# Patient Record
Sex: Male | Born: 1990 | Race: White | Hispanic: No | Marital: Single | Smoking: Current some day smoker
Health system: Southern US, Community
[De-identification: ages and names within clinical notes are randomized; demographics above are authoritative.]

## PROBLEM LIST (undated history)

## (undated) DIAGNOSIS — F32A Depression, unspecified: Secondary | ICD-10-CM

## (undated) DIAGNOSIS — F419 Anxiety disorder, unspecified: Secondary | ICD-10-CM

## (undated) DIAGNOSIS — F329 Major depressive disorder, single episode, unspecified: Secondary | ICD-10-CM

## (undated) HISTORY — PX: APPENDECTOMY: SHX54

---
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
## 2012-03-07 ENCOUNTER — Emergency Department (HOSPITAL_COMMUNITY)
Admission: EM | Admit: 2012-03-07 | Discharge: 2012-03-07 | Disposition: A | Discharge status: Home / Self Care | Payer: Self-pay | Attending: Emergency Medicine | Admitting: Emergency Medicine

## 2012-03-07 DIAGNOSIS — W260XXA Contact with knife, initial encounter: Secondary | ICD-10-CM | POA: Insufficient documentation

## 2012-03-07 DIAGNOSIS — S61209A Unspecified open wound of unspecified finger without damage to nail, initial encounter: Secondary | ICD-10-CM | POA: Insufficient documentation

## 2012-03-07 DIAGNOSIS — Y929 Unspecified place or not applicable: Secondary | ICD-10-CM | POA: Insufficient documentation

## 2012-03-07 DIAGNOSIS — Y9389 Activity, other specified: Secondary | ICD-10-CM | POA: Insufficient documentation

## 2012-03-07 DIAGNOSIS — S61219A Laceration without foreign body of unspecified finger without damage to nail, initial encounter: Secondary | ICD-10-CM

## 2012-03-07 DIAGNOSIS — W261XXA Contact with sword or dagger, initial encounter: Secondary | ICD-10-CM | POA: Insufficient documentation

## 2012-03-07 NOTE — ED Notes (Signed)
Pt was cutting an apple and  Slipped and cut his left pinky finger tip with a knife

## 2012-03-07 NOTE — ED Provider Notes (Signed)
History     CSN: [id number]  Arrival date & time 03/07/12  1723   First MD Initiated Contact with Patient 03/07/12 1743      No chief complaint on file.   (Consider location/radiation/quality/duration/timing/severity/associated sxs/prior treatment) HPI Comments: Patient presents with a laceration of the distal palmar surface of his right index finger.  Laceration approximately 1 cm in length.  He reports that approximately 3 hours prior to arrival he cut his finger with a clean knife while cutting an apple.  Bleeding controlled at this time.  He has been applying pressure to the area with a guaze.  He is not diabetic.  Not immunocompromised.  Last Tetanus was three years ago.  The history is provided by the patient.    No past medical history on file.  No past surgical history on file.  No family history on file.  History  Substance Use Topics  . Smoking status: Not on file  . Smokeless tobacco: Not on file  . Alcohol Use: Not on file      Review of Systems  Skin:       Finger laceration  Neurological: Negative for numbness.  All other systems reviewed and are negative.    Allergies  Review of patient's allergies indicates no known allergies.  Home Medications   Current Outpatient Rx  Name  Route  Sig  Dispense  Refill  . B COMPLEX VITAMINS PO CAPS   Oral   Take 1 capsule by mouth daily.         Marland Kitchen DIPHENHYDRAMINE HCL 25 MG PO TABS   Oral   Take 25 mg by mouth every 6 (six) hours as needed. allergies         . IBUPROFEN 200 MG PO TABS   Oral   Take 200 mg by mouth every 6 (six) hours as needed. pain         . ST JOHNS WORT EXTRACT PO   Oral   Take 1 capsule by mouth daily.           There were no vitals taken for this visit.  Physical Exam  Nursing note and vitals reviewed. Constitutional: He appears well-developed and well-nourished. No distress.  HENT:  Head: Normocephalic and atraumatic.  Cardiovascular: Normal rate, regular rhythm  and normal heart sounds.   Pulses:      Radial pulses are 2+ on the right side, and 2+ on the left side.  Pulmonary/Chest: Effort normal and breath sounds normal.  Musculoskeletal:       Patient with full flexion of the right index DIP, PIP, and MCP  Neurological: He is alert. No sensory deficit.       Distal sensation of the right index finger intact  Skin: Skin is warm. He is not diaphoretic.       Approximately 1 cm linear superficial laceration of the distal right index finger.  No involvement of the nail.  Psychiatric: He has a normal mood and affect.    ED Course  Procedures (including critical care time)  Labs Reviewed - No data to display No results found.   No diagnosis found.  LACERATION REPAIR Performed by: Anne Shutter, Krystale Rinkenberger Authorized by: Anne Shutter, Herbert Seta Consent: Verbal consent obtained. Risks and benefits: risks, benefits and alternatives were discussed Consent given by: patient Patient identity confirmed: provided demographic data Prepped and Draped in normal sterile fashion Wound explored  Laceration Location: right index finger  Laceration Length: 1 cm  No Foreign Bodies seen  or palpated  Irrigation method: syringe Amount of cleaning: standard  Skin closure: Dermabond  Patient tolerance: Patient tolerated the procedure well with no immediate complications.   MDM  Patient presenting with a superficial laceration of the right index finger.  Laceration occurred while slicing an apple.  Laceration repaired with Dermabond without complications.  Patient neurovascularly intact.  Tetanus UTD.  Return precautions discussed.        Pascal Lux [place], PA-C 03/07/12 2117

## 2012-03-07 NOTE — ED Provider Notes (Signed)
Medical screening examination/treatment/procedure(s) were performed by non-physician practitioner and as supervising physician I was immediately available for consultation/collaboration.  Geoffery Lyons, MD 03/07/12 [phone]

## 2015-04-25 ENCOUNTER — Emergency Department (HOSPITAL_COMMUNITY): Payer: Self-pay

## 2015-04-25 ENCOUNTER — Encounter (HOSPITAL_COMMUNITY): Payer: Self-pay

## 2015-04-25 ENCOUNTER — Emergency Department (HOSPITAL_COMMUNITY)
Admission: EM | Admit: 2015-04-25 | Discharge: 2015-04-25 | Disposition: A | Discharge status: Home / Self Care | Payer: Self-pay | Attending: Emergency Medicine | Admitting: Emergency Medicine

## 2015-04-25 DIAGNOSIS — J159 Unspecified bacterial pneumonia: Secondary | ICD-10-CM | POA: Insufficient documentation

## 2015-04-25 DIAGNOSIS — J189 Pneumonia, unspecified organism: Secondary | ICD-10-CM

## 2015-04-25 DIAGNOSIS — F419 Anxiety disorder, unspecified: Secondary | ICD-10-CM | POA: Insufficient documentation

## 2015-04-25 DIAGNOSIS — F329 Major depressive disorder, single episode, unspecified: Secondary | ICD-10-CM | POA: Insufficient documentation

## 2015-04-25 DIAGNOSIS — F17200 Nicotine dependence, unspecified, uncomplicated: Secondary | ICD-10-CM | POA: Insufficient documentation

## 2015-04-25 DIAGNOSIS — Z79899 Other long term (current) drug therapy: Secondary | ICD-10-CM | POA: Insufficient documentation

## 2015-04-25 HISTORY — DX: Anxiety disorder, unspecified: F41.9

## 2015-04-25 HISTORY — DX: Major depressive disorder, single episode, unspecified: F32.9

## 2015-04-25 HISTORY — DX: Depression, unspecified: F32.A

## 2015-04-25 LAB — CBC WITH DIFFERENTIAL/PLATELET
BASOS ABS: 0 10*3/uL (ref 0.0–0.1)
Basophils Relative: 0 %
Eosinophils Absolute: 0.1 10*3/uL (ref 0.0–0.7)
Eosinophils Relative: 2 %
HEMATOCRIT: 44 % (ref 39.0–52.0)
HEMOGLOBIN: 15.1 g/dL (ref 13.0–17.0)
LYMPHS PCT: 18 %
Lymphs Abs: 1.1 10*3/uL (ref 0.7–4.0)
MCH: 31 pg (ref 26.0–34.0)
MCHC: 34.3 g/dL (ref 30.0–36.0)
MCV: 90.3 fL (ref 78.0–100.0)
Monocytes Absolute: 0.5 10*3/uL (ref 0.1–1.0)
Monocytes Relative: 7 %
NEUTROS ABS: 4.5 10*3/uL (ref 1.7–7.7)
NEUTROS PCT: 73 %
Platelets: 212 10*3/uL (ref 150–400)
RBC: 4.87 MIL/uL (ref 4.22–5.81)
RDW: 11.7 % (ref 11.5–15.5)
WBC: 6.2 10*3/uL (ref 4.0–10.5)

## 2015-04-25 LAB — COMPREHENSIVE METABOLIC PANEL
ALT: 20 U/L (ref 17–63)
AST: 22 U/L (ref 15–41)
Albumin: 4.8 g/dL (ref 3.5–5.0)
Alkaline Phosphatase: 80 U/L (ref 38–126)
Anion gap: 9 (ref 5–15)
BILIRUBIN TOTAL: 0.4 mg/dL (ref 0.3–1.2)
BUN: 10 mg/dL (ref 6–20)
CO2: 29 mmol/L (ref 22–32)
CREATININE: 0.93 mg/dL (ref 0.61–1.24)
Calcium: 9.6 mg/dL (ref 8.9–10.3)
Chloride: 104 mmol/L (ref 101–111)
GFR calc Af Amer: >60 mL/min (ref >60)
GLUCOSE: 102 mg/dL — AB (ref 65–99)
Potassium: 5 mmol/L (ref 3.5–5.1)
Sodium: 142 mmol/L (ref 135–145)
TOTAL PROTEIN: 8 g/dL (ref 6.5–8.1)

## 2015-04-25 LAB — URINALYSIS, ROUTINE W REFLEX MICROSCOPIC
Bilirubin Urine: NEGATIVE
GLUCOSE, UA: NEGATIVE mg/dL
Hgb urine dipstick: NEGATIVE
Ketones, ur: NEGATIVE mg/dL
LEUKOCYTES UA: NEGATIVE
Nitrite: NEGATIVE
PROTEIN: NEGATIVE mg/dL
Specific Gravity, Urine: 1.004 — ABNORMAL LOW (ref 1.005–1.030)
pH: 7.5 (ref 5.0–8.0)

## 2015-04-25 LAB — LIPASE, BLOOD: LIPASE: 31 U/L (ref 11–51)

## 2015-04-25 MED ORDER — CEPHALEXIN 500 MG PO CAPS: 500.0000 mg | ORAL_CAPSULE | Freq: Four times a day (QID) | ORAL | Status: AC

## 2015-04-25 MED ORDER — KETOROLAC TROMETHAMINE 60 MG/2ML IM SOLN
60.0000 mg | Freq: Once | INTRAMUSCULAR | Status: AC
  Administered 2015-04-25: 60 mg via INTRAMUSCULAR
  Filled 2015-04-25: qty 2

## 2015-04-25 NOTE — ED Notes (Signed)
Pt states rt flank pain since Wednesday Morning.  No change in urination or fever.  Pt also here for insomnia d/t medication withdrawal.   Using marijuana to help go to sleep

## 2015-04-25 NOTE — ED Provider Notes (Signed)
CSN: [id number]     Arrival date & time 04/25/15  0907 History   First MD Initiated Contact with Patient 04/25/15 [phone]     Chief Complaint  Patient presents with  . Flank Pain     (Consider location/radiation/quality/duration/timing/severity/associated sxs/prior Treatment) Patient is a 25 y.o. male presenting with flank pain.  Flank Pain This is a new problem. The current episode started more than 2 days ago. The problem occurs constantly. The problem has not changed since onset.Pertinent negatives include no chest pain, no abdominal pain, no headaches and no shortness of breath. Nothing aggravates the symptoms. Nothing relieves the symptoms. He has tried nothing for the symptoms. The treatment provided no relief.    25 yo M with a chief complaint right flank pain. This been going on for about 5 or 6 days. Patient is unsure what makes it worse. Pain is been slowly worsening over that time course. Describes it as sharp and stabbing comes in waves. Denies radiation. Denies fevers or chills nausea or vomiting. Denies urinary symptoms. Denies injuries. Denies abdominal pain. Denies cough congestion. The patient is also concerned about insomnia. Patient had stopped an antidepressant about a year ago and has had persistent difficulty staying asleep. Has tried Benadryl melatonin marijuana.  Past Medical History  Diagnosis Date  . Depression   . Anxiety    Past Surgical History  Procedure Laterality Date  . Appendectomy     History reviewed. No pertinent family history. Social History  Substance Use Topics  . Smoking status: Current Some Day Smoker  . Smokeless tobacco: None  . Alcohol Use: Yes     Comment: social    Review of Systems  Constitutional: Negative for fever and chills.  HENT: Negative for congestion and facial swelling.   Eyes: Negative for discharge and visual disturbance.  Respiratory: Negative for shortness of breath.   Cardiovascular: Negative for chest pain and  palpitations.  Gastrointestinal: Negative for vomiting, abdominal pain and diarrhea.  Genitourinary: Positive for flank pain. Negative for hematuria.  Musculoskeletal: Negative for myalgias and arthralgias.  Skin: Negative for color change and rash.  Neurological: Negative for tremors, syncope and headaches.  Psychiatric/Behavioral: Negative for confusion and dysphoric mood.      Allergies  Review of patient's allergies indicates no known allergies.  Home Medications   Prior to Admission medications   Medication Sig Start Date End Date Taking? Authorizing Provider  diphenhydrAMINE (BENADRYL) 25 MG tablet Take 25 mg by mouth every 6 (six) hours as needed. allergies   Yes Historical Provider, MD  ibuprofen (ADVIL,MOTRIN) 200 MG tablet Take 200 mg by mouth every 6 (six) hours as needed. pain   Yes Historical Provider, MD  magnesium oxide (MAG-OX) 400 MG tablet Take 400 mg by mouth 2 (two) times daily as needed (muscle spasms).   Yes Historical Provider, MD  Multiple Vitamins-Minerals (MULTIVITAMIN WITH MINERALS) tablet Take 1 tablet by mouth daily.   Yes Historical Provider, MD  traMADol (ULTRAM) 50 MG tablet Take 100 mg by mouth at bedtime as needed (sleep).   Yes Historical Provider, MD  cephALEXin (KEFLEX) 500 MG capsule Take 1 capsule (500 mg total) by mouth 4 (four) times daily. 04/25/15   Melene Plan, DO   BP 130/78 mmHg  Pulse 76  Temp(Src) 98.4 F (36.9 C) (Oral)  Resp 18  SpO2 100% Physical Exam  Constitutional: He is oriented to person, place, and time. He appears well-developed and well-nourished.  HENT:  Head: Normocephalic and atraumatic.  Eyes: EOM  are normal. Pupils are equal, round, and reactive to light.  Neck: Normal range of motion. Neck supple. No JVD present.  Cardiovascular: Normal rate and regular rhythm.  Exam reveals no gallop and no friction rub.   No murmur heard. Pulmonary/Chest: No respiratory distress. He has no wheezes.  Abdominal: He exhibits no  distension. There is no tenderness. There is no rebound and no guarding.  No cva ttp  Musculoskeletal: Normal range of motion.  Neurological: He is alert and oriented to person, place, and time.  Skin: No rash noted. No pallor.  Psychiatric: He has a normal mood and affect. His behavior is normal.  Nursing note and vitals reviewed.   ED Course  Procedures (including critical care time) Labs Review Labs Reviewed  URINALYSIS, ROUTINE W REFLEX MICROSCOPIC (NOT AT [facility]) - Abnormal; Notable for the following:    Specific Gravity, Urine 1.004 (*)    All other components within normal limits  COMPREHENSIVE METABOLIC PANEL - Abnormal; Notable for the following:    Glucose, Bld 102 (*)    All other components within normal limits  CBC WITH DIFFERENTIAL/PLATELET  LIPASE, BLOOD    Imaging Review Ct Renal Stone Study  04/25/2015  CLINICAL DATA:  25 year old with right flank pain for nearly a week. Pain radiating to the back. Initial encounter. EXAM: CT ABDOMEN AND PELVIS WITHOUT CONTRAST TECHNIQUE: Multidetector CT imaging of the abdomen and pelvis was performed following the standard protocol without IV contrast. COMPARISON:  Negative. FINDINGS: 3.5 cm diameter area of rounded confluent opacity in the right lateral costophrenic angle at the lateral basal segment of the lower lobe (series 4, image 13 and series 5, image 47. This has patchy and indistinct margins. There is no associated right pleural effusion. Elsewhere the lung bases are negative.  No pericardial effusion. No acute osseous abnormality identified. No pelvic free fluid. Retained stool in the distal colon with some redundancy, especially the sigmoid which is moderately redundant. Less retained stool in the left colon. Mildly redundant splenic flexure. Retained stool throughout the transverse and right colon. Sequelae of appendectomy with multiple surgical clips at the cecum. Negative terminal ileum. No dilated small bowel. Decompressed  stomach and duodenum. No abdominal free air or free fluid. Noncontrast liver, gallbladder, spleen, pancreas and adrenal glands are within normal limits. No nephrolithiasis or perinephric stranding. Proximal ureters appear within normal limits. No periureteral stranding. No calculus identified along the course of either ureter or in the bladder. Diminutive urinary bladder. The bladder is somewhat thick walled, but this might be artifact due to under distension. No lymphadenopathy identified. IMPRESSION: 1. Symptomatic abnormality favored to be round airspace disease in the lateral basal segment of the right lower lobe most compatible at this time with round pneumonia. No associated pleural effusion. Followup PA and lateral chest X-ray is recommended in 3-4 weeks following trial of antibiotic therapy to ensure resolution and exclude underlying malignancy. 2. Mildly thick-walled appearance of the urinary bladder, but may be artifact due to under distension. 3. Otherwise negative noncontrast abdomen with prior appendectomy. Electronically Signed   By: Odessa Fleming M.D.   On: 04/25/2015 10:33   I have personally reviewed and evaluated these images and lab results as part of my medical decision-making.   EKG Interpretation None      MDM   Final diagnoses:  CAP (community acquired pneumonia)    25 yo M with a chief complaints of right flank pain. Is been going on for about a week. Colicky in nature  has no history of kidney stones. Will obtain a CT stone study.  Found to have RLL pna.  On questioning patient has had cough and fever.  Will start on abx.  PCP follow up given.  3:50 PM:  I have discussed the diagnosis/risks/treatment options with the patient and believe the pt to be eligible for discharge home to follow-up with PCP. We also discussed returning to the ED immediately if new or worsening sx occur. We discussed the sx which are most concerning (e.g., sudden worsening pain, sob) that necessitate  immediate return. Medications administered to the patient during their visit and any new prescriptions provided to the patient are listed below.  Medications given during this visit Medications  ketorolac (TORADOL) injection 60 mg (60 mg Intramuscular Given 04/25/15 1034)    Discharge Medication List as of 04/25/2015 11:19 AM    START taking these medications   Details  cephALEXin (KEFLEX) 500 MG capsule Take 1 capsule (500 mg total) by mouth 4 (four) times daily., Starting 04/25/2015, Until Discontinued, Print        The patient appears reasonably screen and/or stabilized for discharge and I doubt any other medical condition or other [facility] requiring further screening, evaluation, or treatment in the ED at this time prior to discharge.    Melene Plan, DO 04/25/15 1550

## 2015-04-25 NOTE — Discharge Instructions (Signed)

## 2015-04-25 NOTE — Progress Notes (Signed)
Pt seen by [facility] staff, Kennyth Arnold

## 2015-05-08 ENCOUNTER — Encounter (HOSPITAL_COMMUNITY): Payer: Self-pay

## 2015-05-08 ENCOUNTER — Emergency Department (HOSPITAL_COMMUNITY)
Admission: EM | Admit: 2015-05-08 | Discharge: 2015-05-08 | Disposition: A | Discharge status: Home / Self Care | Payer: Self-pay | Attending: Emergency Medicine | Admitting: Emergency Medicine

## 2015-05-08 ENCOUNTER — Emergency Department (HOSPITAL_COMMUNITY): Payer: Self-pay

## 2015-05-08 DIAGNOSIS — F419 Anxiety disorder, unspecified: Secondary | ICD-10-CM | POA: Insufficient documentation

## 2015-05-08 DIAGNOSIS — Z79899 Other long term (current) drug therapy: Secondary | ICD-10-CM | POA: Insufficient documentation

## 2015-05-08 DIAGNOSIS — F329 Major depressive disorder, single episode, unspecified: Secondary | ICD-10-CM | POA: Insufficient documentation

## 2015-05-08 DIAGNOSIS — J069 Acute upper respiratory infection, unspecified: Secondary | ICD-10-CM | POA: Insufficient documentation

## 2015-05-08 DIAGNOSIS — F17200 Nicotine dependence, unspecified, uncomplicated: Secondary | ICD-10-CM | POA: Insufficient documentation

## 2015-05-08 MED ORDER — BENZONATATE 100 MG PO CAPS: 200.0000 mg | ORAL_CAPSULE | Freq: Two times a day (BID) | ORAL | Status: AC | PRN

## 2015-05-08 MED ORDER — OXYMETAZOLINE HCL 0.05 % NA SOLN: 1.0000 | Freq: Two times a day (BID) | NASAL | Status: AC

## 2015-05-08 NOTE — ED Provider Notes (Signed)
CSN: [id number]     Arrival date & time 05/08/15  4098 History   First MD Initiated Contact with Patient 05/08/15 1059     Chief Complaint  Patient presents with  . Chills  . Ribcage Pain   . Cough     (Consider location/radiation/quality/duration/timing/severity/associated sxs/prior Treatment) HPI   Patient is a 25 year old male with past medical history of depression and anxiety who presents the ED with multiple complaints, onset 3 days. Patient reports he was seen in the ED on 04/25/15, diagnosed with CAP and discharged home with Keflex. He notes he completed his antibiotic and states his symptoms resolved until 3 days ago when he began having symptoms again. He endorses having "right lung soreness", chills, low-grade fever (99.3), cough with intermittent clear sputum, nasal congestion, rhinorrhea, sore throat and shortness of breath. He states he has been taking ibuprofen and Benadryl at home with mild intermittent relief. Patient also reports that he smokes cannabis nightly in order to help him stay asleep. He notes he stopped taking his antidepressant a year ago but has not followed up with a PCP due to lack of insurance.   Past Medical History  Diagnosis Date  . Depression   . Anxiety    Past Surgical History  Procedure Laterality Date  . Appendectomy     History reviewed. No pertinent family history. Social History  Substance Use Topics  . Smoking status: Current Some Day Smoker  . Smokeless tobacco: None  . Alcohol Use: Yes     Comment: social    Review of Systems  Constitutional: Positive for fever.  HENT: Positive for congestion and sore throat.   Respiratory: Positive for cough, chest tightness and shortness of breath.   Cardiovascular: Positive for chest pain.  All other systems reviewed and are negative.     Allergies  Review of patient's allergies indicates no known allergies.  Home Medications   Prior to Admission medications   Medication Sig Start  Date End Date Taking? Authorizing Provider  benzonatate (TESSALON) 100 MG capsule Take 2 capsules (200 mg total) by mouth 2 (two) times daily as needed for cough. 05/08/15   Barrett Henle, PA-C  cephALEXin (KEFLEX) 500 MG capsule Take 1 capsule (500 mg total) by mouth 4 (four) times daily. 04/25/15   Melene Plan, DO  diphenhydrAMINE (BENADRYL) 25 MG tablet Take 25 mg by mouth every 6 (six) hours as needed. allergies    Historical Provider, MD  ibuprofen (ADVIL,MOTRIN) 200 MG tablet Take 200 mg by mouth every 6 (six) hours as needed. pain    Historical Provider, MD  magnesium oxide (MAG-OX) 400 MG tablet Take 400 mg by mouth 2 (two) times daily as needed (muscle spasms).    Historical Provider, MD  Multiple Vitamins-Minerals (MULTIVITAMIN WITH MINERALS) tablet Take 1 tablet by mouth daily.    Historical Provider, MD  oxymetazoline (AFRIN NASAL SPRAY) 0.05 % nasal spray Place 1 spray into both nostrils 2 (two) times daily. 05/08/15   Barrett Henle, PA-C  traMADol (ULTRAM) 50 MG tablet Take 100 mg by mouth at bedtime as needed (sleep).    Historical Provider, MD   BP 128/78 mmHg  Pulse 95  Temp(Src) 98.7 F (37.1 C) (Oral)  Resp 16  SpO2 100% Physical Exam  Constitutional: He is oriented to person, place, and time. He appears well-developed and well-nourished. No distress.  HENT:  Head: Normocephalic and atraumatic.  Right Ear: Tympanic membrane normal.  Left Ear: Tympanic membrane normal.  Nose: Rhinorrhea  present. Right sinus exhibits no maxillary sinus tenderness and no frontal sinus tenderness. Left sinus exhibits no maxillary sinus tenderness and no frontal sinus tenderness.  Mouth/Throat: Uvula is midline, oropharynx is clear and moist and mucous membranes are normal. No oropharyngeal exudate, posterior oropharyngeal edema, posterior oropharyngeal erythema or tonsillar abscesses.  Eyes: Conjunctivae and EOM are normal. Right eye exhibits no discharge. Left eye exhibits no  discharge. No scleral icterus.  Neck: Normal range of motion. Neck supple.  Cardiovascular: Normal rate, regular rhythm, normal heart sounds and intact distal pulses.   Pulmonary/Chest: Effort normal and breath sounds normal. No respiratory distress. He has no wheezes. He has no rales. He exhibits no tenderness.  Abdominal: Soft. Bowel sounds are normal. There is no tenderness.  Musculoskeletal: Normal range of motion. He exhibits no edema or tenderness.  Lymphadenopathy:    He has no cervical adenopathy.  Neurological: He is alert and oriented to person, place, and time.  Skin: Skin is warm and dry. He is not diaphoretic.  Nursing note and vitals reviewed.   ED Course  Procedures (including critical care time) Labs Review Labs Reviewed - No data to display  Imaging Review Dg Chest 2 View  05/08/2015  CLINICAL DATA:  25 year old male with cough and congestion. EXAM: CHEST  2 VIEW COMPARISON:  None. FINDINGS: The heart size and mediastinal contours are within normal limits. Both lungs are clear. The visualized skeletal structures are unremarkable. IMPRESSION: No active cardiopulmonary disease. Electronically Signed   By: Elgie Collard M.D.   On: 05/08/2015 10:14   I have personally reviewed and evaluated these images and lab results as part of my medical decision-making.    MDM   Final diagnoses:  URI (upper respiratory infection)   Pt CXR negative for acute infiltrate. Patients symptoms are consistent with URI, likely viral etiology. Discussed that antibiotics are not indicated for viral infections. Pt will be discharged with symptomatic treatment.  Patient was also given follow-up information for the health and wellness clinic and advised to schedule a follow-up appointment with them regarding his insomnia. Verbalizes understanding and is agreeable with plan. Pt is hemodynamically stable & in NAD prior to dc.     Satira Sark [place], [place] 05/08/15 1131  Pricilla Loveless,  MD 05/10/15 [phone]

## 2015-05-08 NOTE — ED Notes (Addendum)
Pt c/o chills, cough, sore throat, and R ribcage/mid back pain x 2 days.  Pain score 4/10.  Pt reports being diagnosed w/ PNA on 2/7 and completed all prescribed medications.  Pt easily speaking full sentences.  Pt has not followed up w/ PCP, due to not having insurance.

## 2015-05-08 NOTE — Discharge Instructions (Signed)
Take your medications as prescribed. He may also use a cool mist humidifier as needed for cough at home. Continue drinking fluids to remain hydrated. He may also drink warm water or tea with half a teaspoon of honey to help with her cough. He may take Tylenol or ibuprofen as prescribed over-the-counter and to help with fever or body aches. I recommend calling the health and wellness clinic listed above to schedule a follow-up appointment for further evaluation regarding your insomnia. Please return to the Emergency Department if symptoms worsen or new onset of uncontrollable fever, difficulty tolerating fluids at home, coughing up blood, vomiting blood, shortness of breath, chest pain.

## 2016-08-10 IMAGING — CT CT RENAL STONE PROTOCOL
2 of 3 series · 15 of 43 positions shown, 17 images · non-contrast
Comparison: Negative.

CLINICAL DATA: 24-year-old with right flank pain for nearly a week.
Pain radiating to the back. Initial encounter.

EXAM:
CT ABDOMEN AND PELVIS WITHOUT CONTRAST
TECHNIQUE: Multidetector CT imaging of the abdomen and pelvis was performed
following the standard protocol without IV contrast.

[Series 4: lung · axial · 0.86mm/px · z∈[+1544,+1664]mm · 12 of 29 slices shown, 14 images]
[im 3/29  soft-tissue]
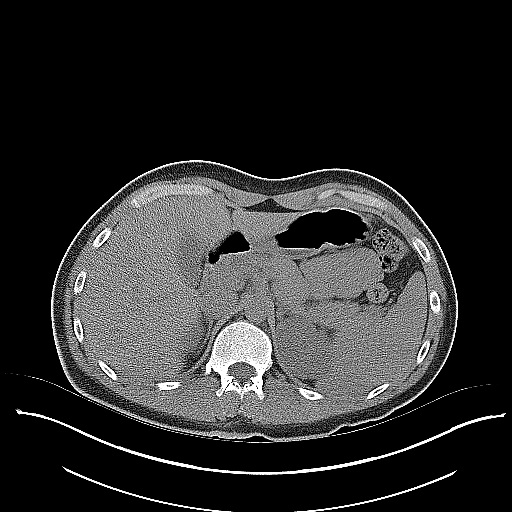
[im 3/29  bone]
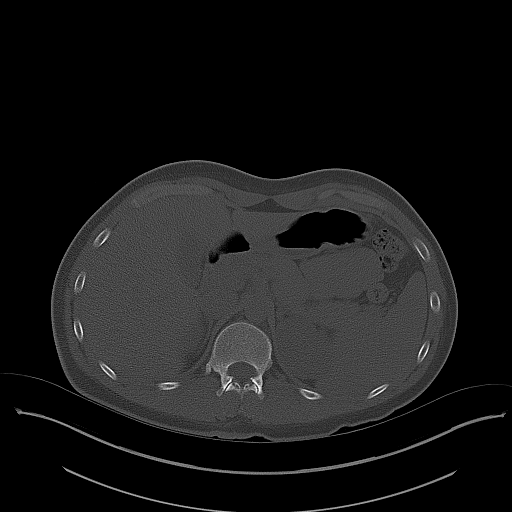
[im 5/29  soft-tissue]
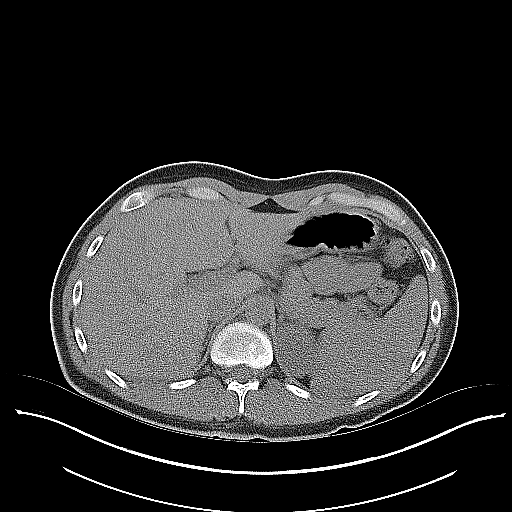
[im 7/29  soft-tissue]
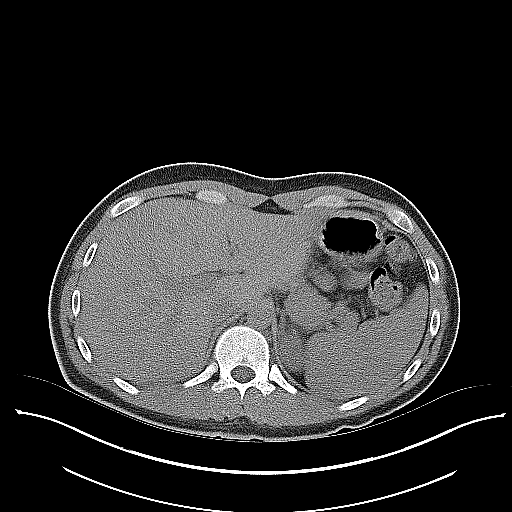
[im 9/29  soft-tissue]
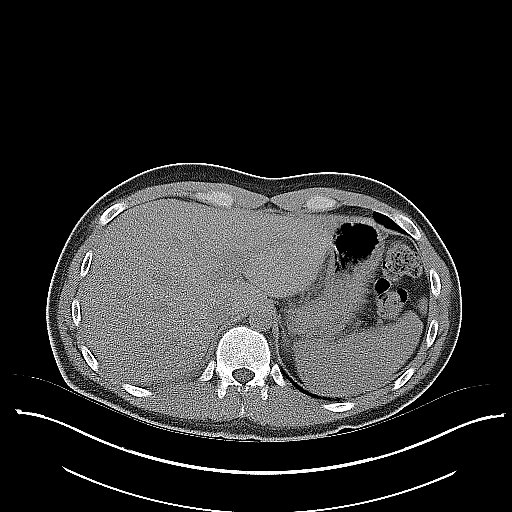
[im 12/29  soft-tissue]
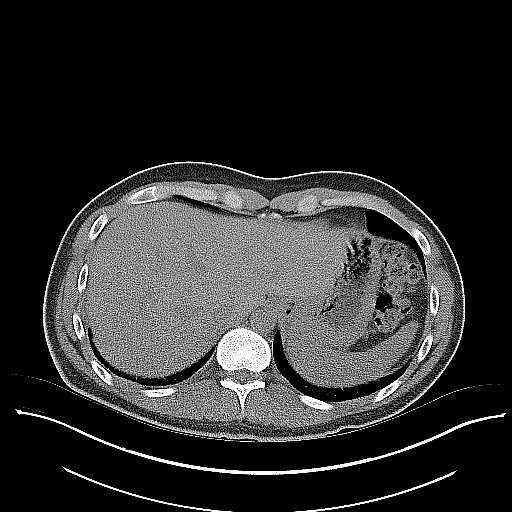
[im 14/29  soft-tissue]
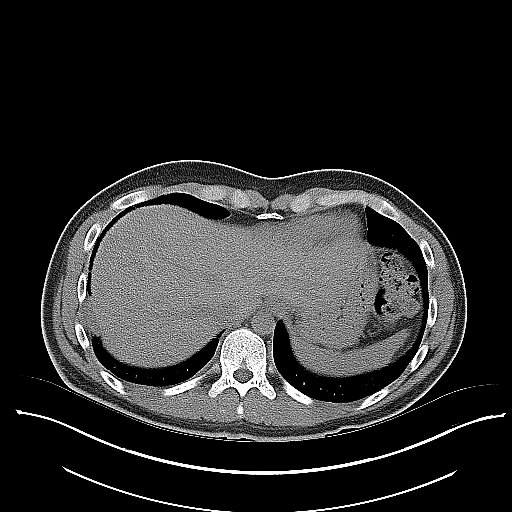
[im 16/29  soft-tissue]
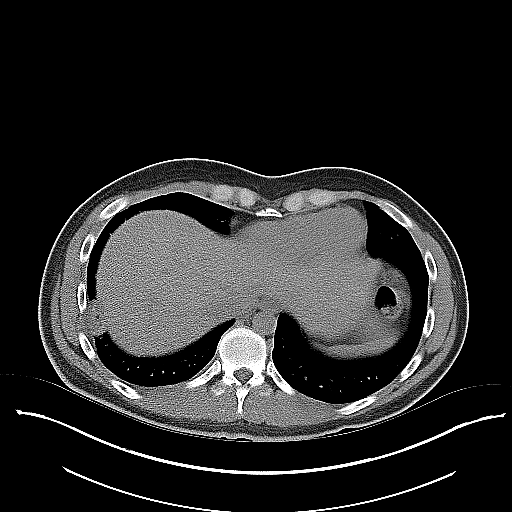
[im 18/29  soft-tissue]
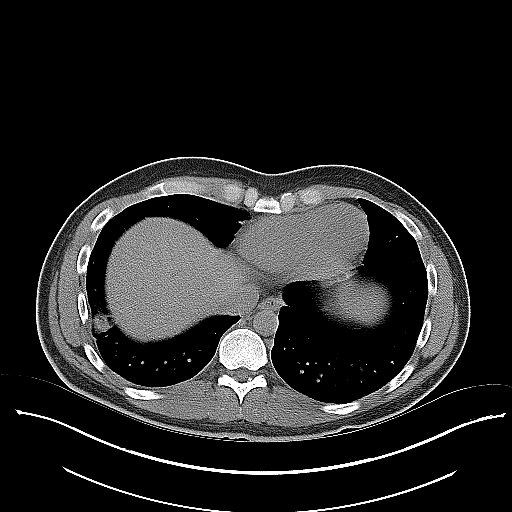
[im 21/29  soft-tissue]
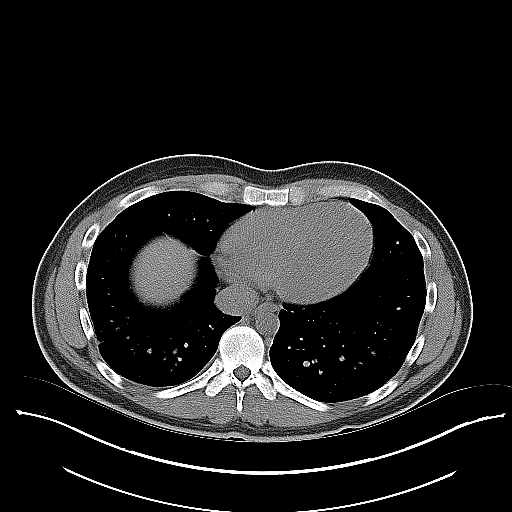
[im 21/29  bone]
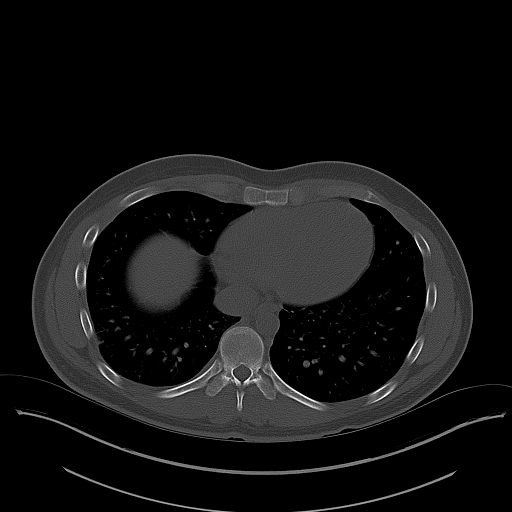
[im 23/29  soft-tissue]
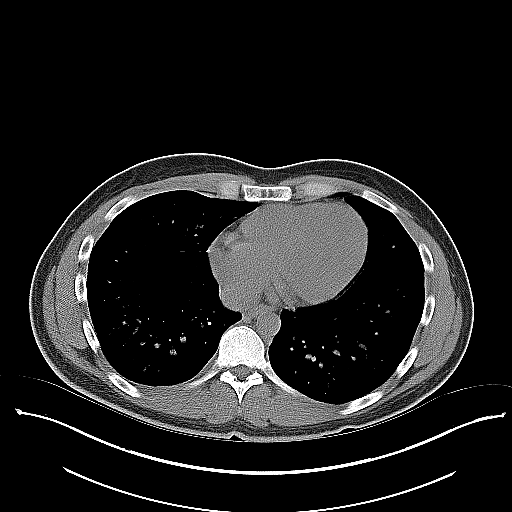
[im 25/29  soft-tissue]
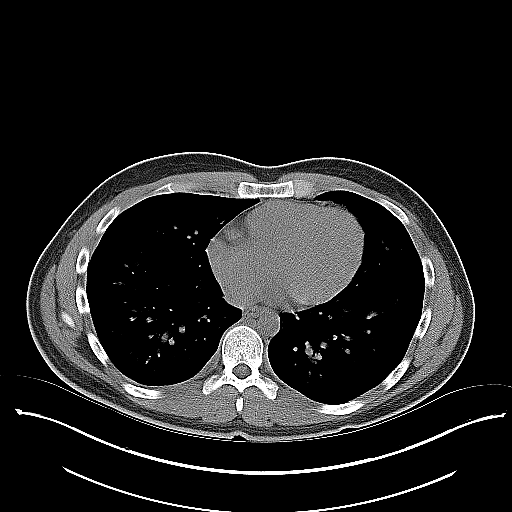
[im 27/29  soft-tissue]
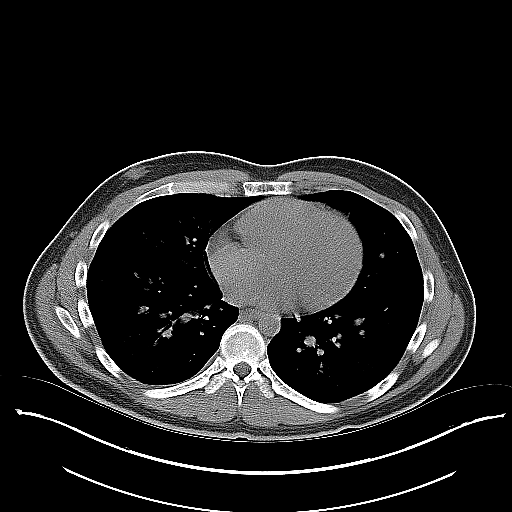

[Series 5: coronal · coronal · 0.74mm/px · 3 of 82 slices shown]
[im 28/82  soft-tissue]
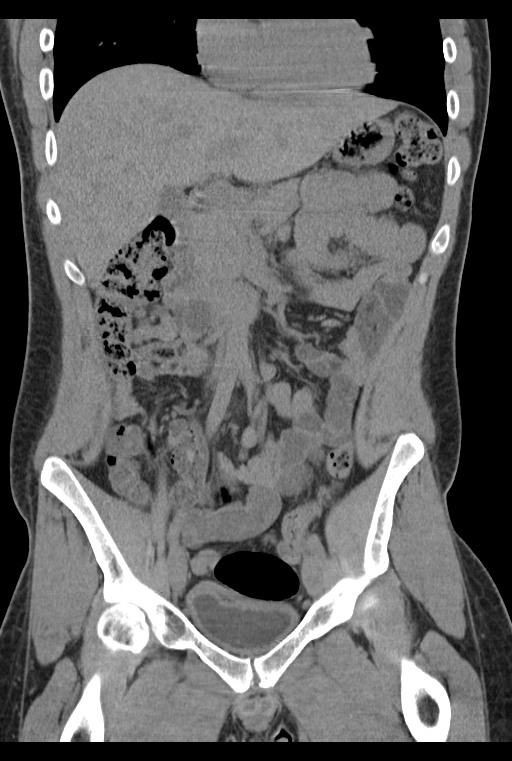
[im 37/82  soft-tissue]
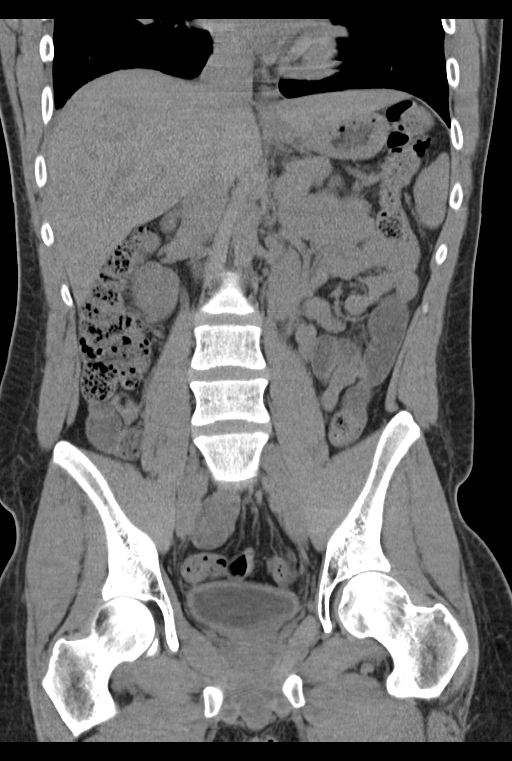
[im 46/82  soft-tissue]
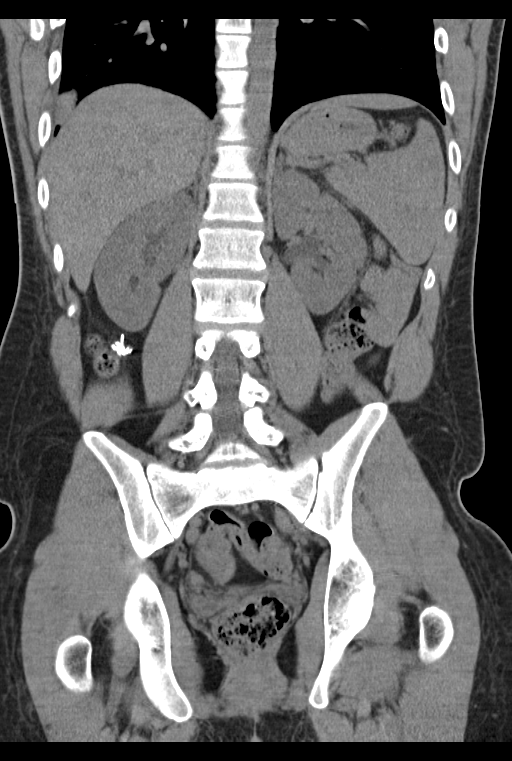

[15 of 43 positions shown; findings below may reference images not displayed]

FINDINGS: 3.5 cm diameter area of rounded confluent opacity in the right
lateral costophrenic angle at the lateral basal segment of the lower
lobe (series 4, image 13 and series 5, image 47. This has patchy and
indistinct margins. There is no associated right pleural effusion.

Elsewhere the lung bases are negative.  No pericardial effusion.

No acute osseous abnormality identified.

No pelvic free fluid. Retained stool in the distal colon with some
redundancy, especially the sigmoid which is moderately redundant.
Less retained stool in the left colon. Mildly redundant splenic
flexure. Retained stool throughout the transverse and right colon.
Sequelae of appendectomy with multiple surgical clips at the cecum.
Negative terminal ileum. No dilated small bowel. Decompressed
stomach and duodenum.

No abdominal free air or free fluid. Noncontrast liver, gallbladder,
spleen, pancreas and adrenal glands are within normal limits.

No nephrolithiasis or perinephric stranding. Proximal ureters appear
within normal limits. No periureteral stranding. No calculus
identified along the course of either ureter or in the bladder.
Diminutive urinary bladder. The bladder is somewhat thick walled,
but this might be artifact due to under distension. No
lymphadenopathy identified.
IMPRESSION: 1. Symptomatic abnormality favored to be round airspace disease in
the lateral basal segment of the right lower lobe most compatible at
this time with round pneumonia. No associated pleural effusion.
Followup PA and lateral chest X-ray is recommended in 3-4 weeks
following trial of antibiotic therapy to ensure resolution and
exclude underlying malignancy.
2. Mildly thick-walled appearance of the urinary bladder, but may be
artifact due to under distension.
3. Otherwise negative noncontrast abdomen with prior appendectomy.

## 2016-08-23 IMAGING — CR DG CHEST 2V
2 series · 2 of 2 positions shown · non-contrast
Comparison: None.

CLINICAL DATA: 24-year-old male with cough and congestion.

EXAM:
CHEST  2 VIEW

[w chest pa]
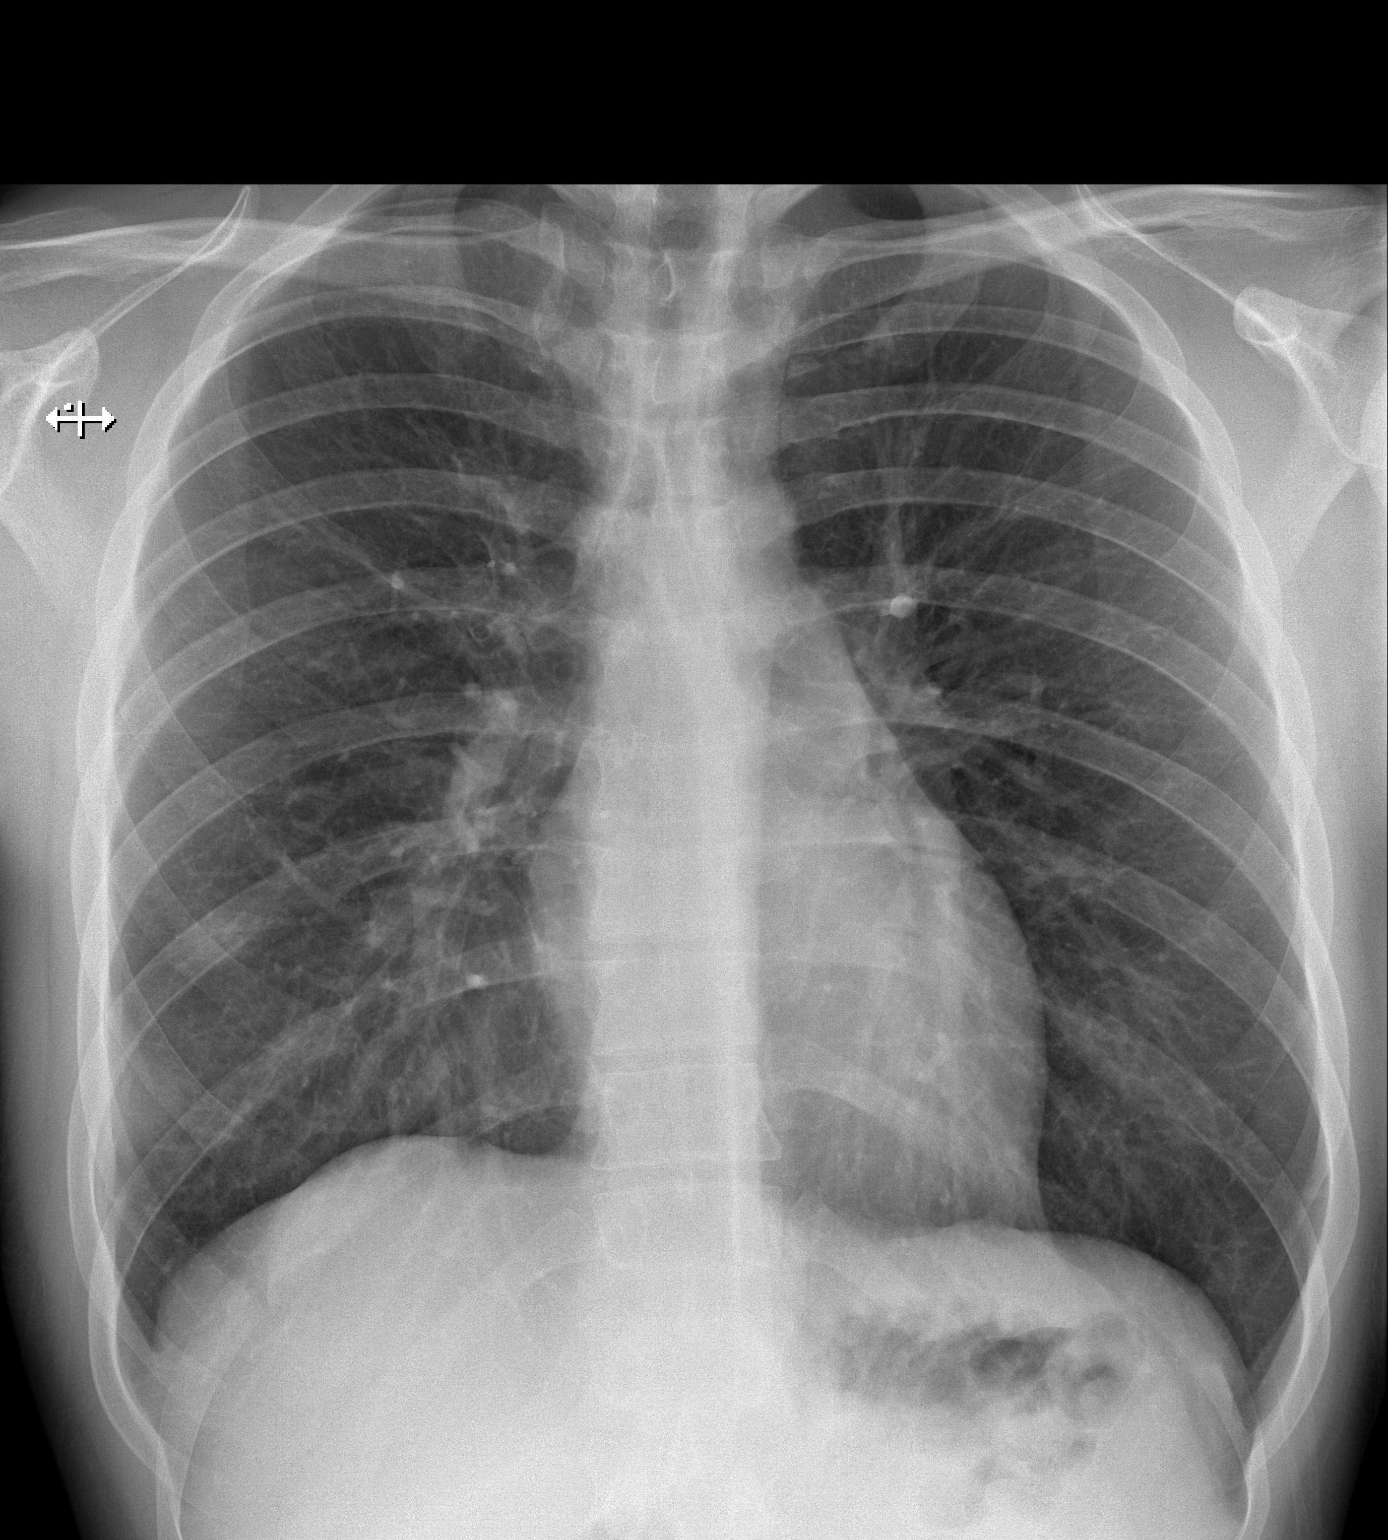

[w chest lat]
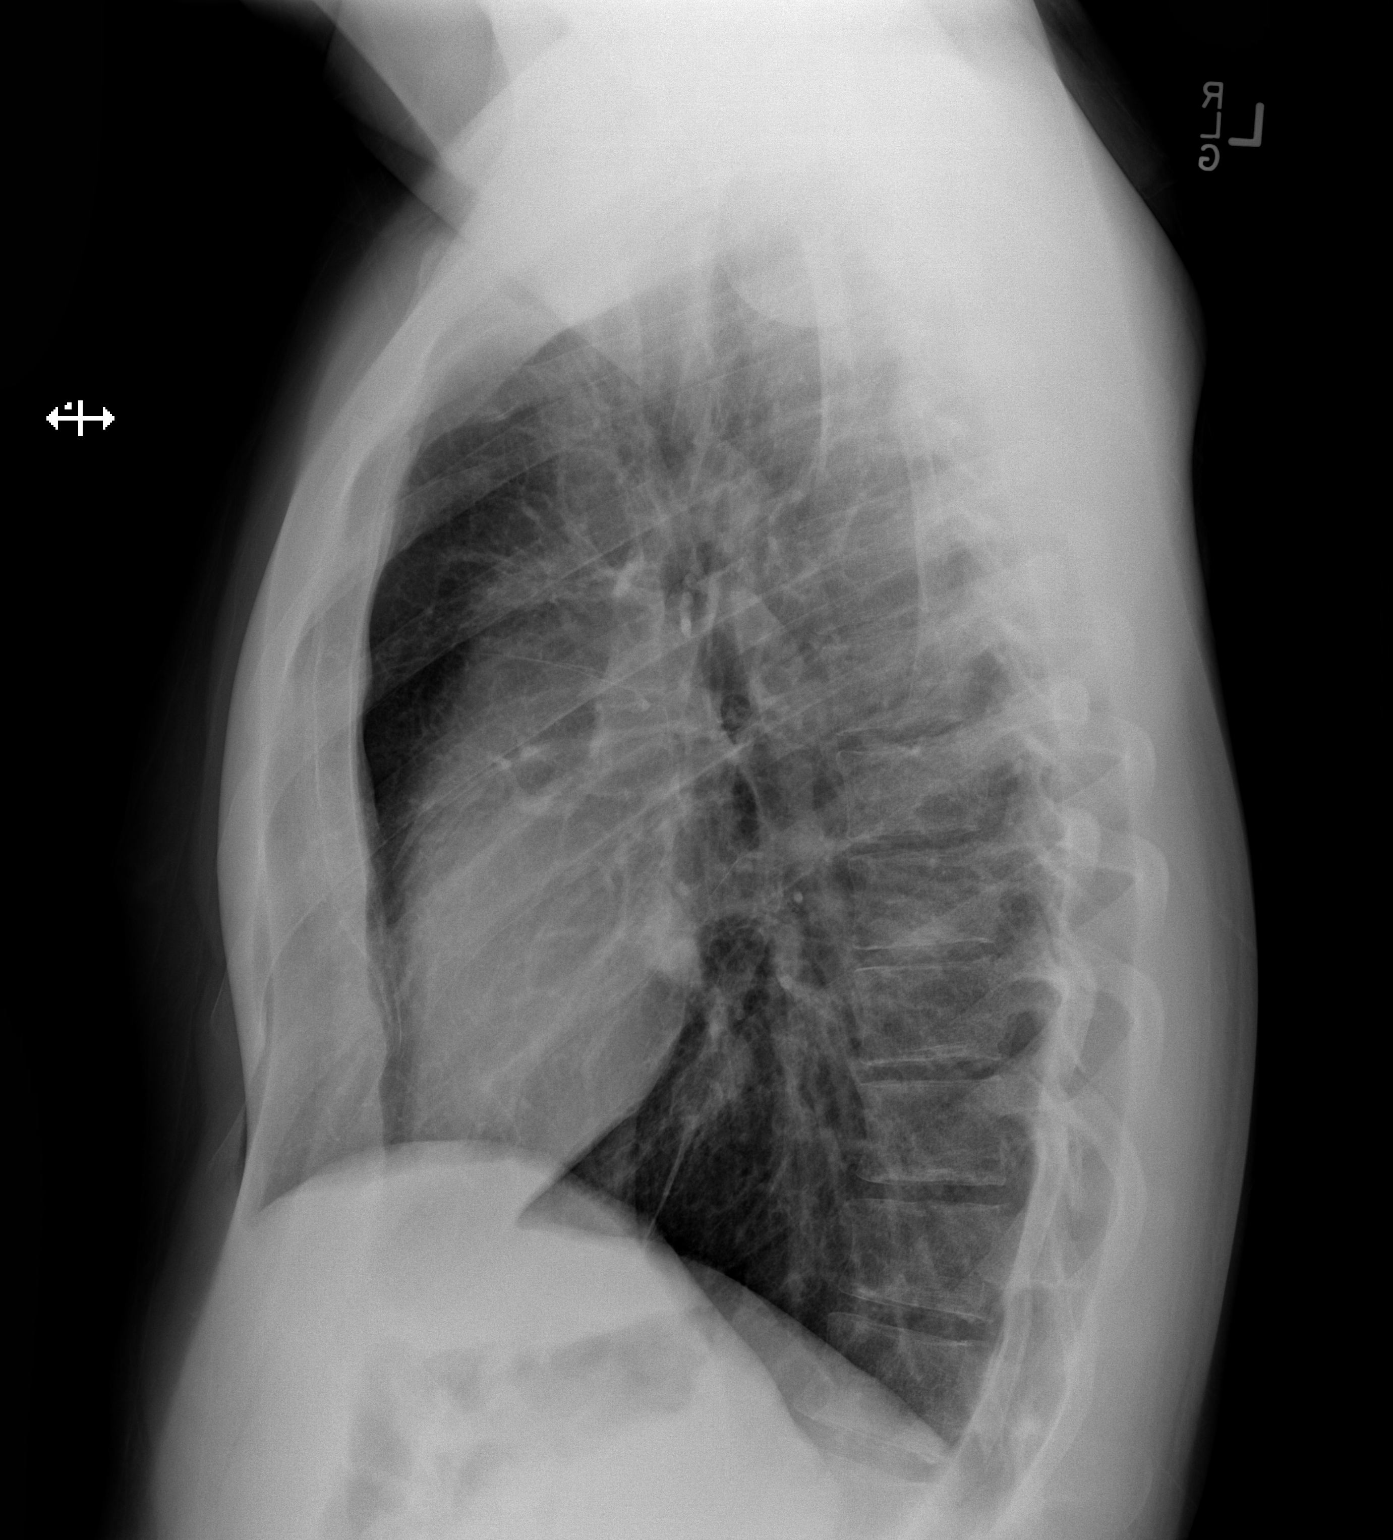

[2 of 2 positions shown; findings below may reference images not displayed]

FINDINGS: The heart size and mediastinal contours are within normal limits.
Both lungs are clear. The visualized skeletal structures are
unremarkable.
IMPRESSION: No active cardiopulmonary disease.
